# Patient Record
Sex: Male | Born: 1985 | Hispanic: Yes | Marital: Married | Smoking: Never smoker
Health system: Southern US, Community
[De-identification: ages and names within clinical notes are randomized; demographics above are authoritative.]

## PROBLEM LIST (undated history)

## (undated) HISTORY — PX: LEG SURGERY: SHX1003

---

## 2017-03-11 ENCOUNTER — Emergency Department
Admission: EM | Admit: 2017-03-11 | Discharge: 2017-03-11 | Disposition: A | Discharge status: Home / Self Care | Payer: Self-pay | Attending: Emergency Medicine | Admitting: Emergency Medicine

## 2017-03-11 ENCOUNTER — Encounter: Payer: Self-pay | Admitting: Emergency Medicine

## 2017-03-11 ENCOUNTER — Emergency Department: Payer: Self-pay

## 2017-03-11 ENCOUNTER — Other Ambulatory Visit: Payer: Self-pay

## 2017-03-11 DIAGNOSIS — Y9389 Activity, other specified: Secondary | ICD-10-CM | POA: Insufficient documentation

## 2017-03-11 DIAGNOSIS — J029 Acute pharyngitis, unspecified: Secondary | ICD-10-CM

## 2017-03-11 DIAGNOSIS — Y929 Unspecified place or not applicable: Secondary | ICD-10-CM | POA: Insufficient documentation

## 2017-03-11 DIAGNOSIS — X58XXXA Exposure to other specified factors, initial encounter: Secondary | ICD-10-CM | POA: Insufficient documentation

## 2017-03-11 DIAGNOSIS — S27818A Other injury of esophagus (thoracic part), initial encounter: Secondary | ICD-10-CM | POA: Insufficient documentation

## 2017-03-11 DIAGNOSIS — Y999 Unspecified external cause status: Secondary | ICD-10-CM | POA: Insufficient documentation

## 2017-03-11 MED ORDER — MAGIC MOUTHWASH W/LIDOCAINE: 5.0000 mL | Freq: Three times a day (TID) | ORAL | 0 refills | Status: DC

## 2017-03-11 MED ORDER — LIDOCAINE VISCOUS 2 % MT SOLN
15.0000 mL | Freq: Once | OROMUCOSAL | Status: AC
  Administered 2017-03-11: 15 mL via OROMUCOSAL
  Filled 2017-03-11: qty 15

## 2017-03-11 NOTE — ED Notes (Signed)
See triage note  States he was eating chips and watching movies last pm  Noticed pain to left side of throat when he went to bed  Feels like something is stuck  No drooling noted

## 2017-03-11 NOTE — ED Triage Notes (Signed)
C/o pain in back of throat but not like a "sore throat, feels like something is pinching in my throat:". Respirations unlabored. Hoarse voice.

## 2017-03-11 NOTE — ED Provider Notes (Signed)
[facility] Emergency Department Provider Note  ____________________________________________   First MD Initiated Contact with Patient 03/11/17 [phone]     (approximate)  I have reviewed the triage vital signs and the nursing notes.   HISTORY  Chief Complaint Sore Throat    HPI Timothy Roberts is a 32 y.o. male complains of feeling like he has something stuck on the left side of his throat and the top of his mouth, states he was eating chips last night prior to going to bed, he states pain is worse this morning, feels like something is stuck, he denies fever chills, he denies any drooling, he denies any difficulty breathing  History reviewed. No pertinent past medical history.  There are no active problems to display for this patient.   Past Surgical History:  Procedure Laterality Date  . LEG SURGERY      Prior to Admission medications   Medication Sig Start Date End Date Taking? Authorizing Provider  magic mouthwash w/lidocaine SOLN Take 5 mLs by mouth 3 (three) times daily. 03/11/17   Faythe Ghee, PA-C    Allergies Patient has no known allergies.  History reviewed. No pertinent family history.  Social History Social History   Tobacco Use  . Smoking status: Never Smoker  . Smokeless tobacco: Never Used  Substance Use Topics  . Alcohol use: No    Frequency: Never  . Drug use: Yes    Types: Marijuana    Review of Systems  Constitutional: No fever/chills Eyes: No visual changes. ENT: Positive sore throat.  Positive hoarse voice Respiratory: Denies cough Genitourinary: Negative for dysuria. Musculoskeletal: Negative for back pain. Skin: Negative for rash.    ____________________________________________   PHYSICAL EXAM:  VITAL SIGNS: ED Triage Vitals  Enc Vitals Group     BP 03/11/17 0913 124/72     Pulse Rate 03/11/17 0913 (!) 58     Resp 03/11/17 0913 16     Temp 03/11/17 0913 97.7 F (36.5 C)     Temp Source  03/11/17 0913 Oral     SpO2 03/11/17 0913 100 %     Weight 03/11/17 0911 150 lb (68 kg)     Height 03/11/17 0911 6\' 2"  (1.88 m)     Head Circumference --      Peak Flow --      Pain Score 03/11/17 0910 7     Pain Loc --      Pain Edu? --      Excl. in GC? --     Constitutional: Alert and oriented. Well appearing and in no acute distress. Eyes: Conjunctivae are normal.  Head: Atraumatic. Nose: No congestion/rhinnorhea. Mouth/Throat: Mucous membranes are moist.  There are no abrasions or swelling noted in the throat, there is no swelling in the palate of the mouth, there is no foreign body noted with NECK: Supple, no lymphadenopathy was noted, no crepitus Cardiovascular: Normal rate, regular rhythm.  Heart sounds are normal Respiratory: Normal respiratory effort.  No retractions, lungs clear to auscultation GU: deferred Musculoskeletal: FROM all extremities, warm and well perfused Neurologic:  Normal speech and language.  Skin:  Skin is warm, dry and intact. No rash noted. Psychiatric: Mood and affect are normal. Speech and behavior are normal.  ____________________________________________   LABS (all labs ordered are listed, but only abnormal results are displayed)  Labs Reviewed - No data to display ____________________________________________   ____________________________________________  RADIOLOGY  X-ray soft tissue of the neck is negative  ____________________________________________  PROCEDURES  Procedure(s) performed: No  Procedures    ____________________________________________   INITIAL IMPRESSION / ASSESSMENT AND PLAN / ED COURSE  Pertinent labs & imaging results that were available during my care of the patient were reviewed by me and considered in my medical decision making (see chart for details).  Patient is a 32 year old male complaining of pain in his throat, he feels like he has something stuck in his throat, he denies fever chills, he is  able to drink water without difficulty   X-ray of the soft tissues of the neck were ordered    ----------------------------------------- 10:30 AM on 03/11/2017 -----------------------------------------  X-ray of the neck is negative, patient states he still has a lot of pain, prescription for Magic mouthwash with lidocaine was provided, he is to follow-up with ear nose and throat, return to the emergency department if he is worsening, patient states he understands follow-up as needed, he was discharged in stable condition  While the nurse was giving discharge instructions, the patient states he used 1 of the Q-tips from our drawer and stuck it in the back of his throat, but now he has blood on Q-tip, on physical exam the throat is very irritated where he has been scraping it with the Q-tip however there is no active bleeding, and there still is no foreign body noted in the posterior throat explained to the patient not to do this to his throat that he is dry Q-tip will tear of the tissue in the back of the throat, patient just shakes his head says "yeah yeah yeah".  He was given 2% viscous lidocaine and discharged by the nurse with strict instructions to not put anything such as a Q-tip in the back of his throat, he is to follow-up with ear nose and throat  As part of my medical decision making, I reviewed the following data within the electronic MEDICAL RECORD NUMBER Nursing notes reviewed and incorporated, Radiograph reviewed , Notes from prior ED visits and [place] Controlled Substance Database  ____________________________________________   FINAL CLINICAL IMPRESSION(S) / ED DIAGNOSES  Final diagnoses:  Sore throat  Esophageal abrasion, initial encounter      NEW MEDICATIONS STARTED DURING THIS VISIT:  New Prescriptions   MAGIC MOUTHWASH W/LIDOCAINE SOLN    Take 5 mLs by mouth 3 (three) times daily.     Note:  This document was prepared using Dragon voice recognition software and may include  unintentional dictation errors.    Faythe GheeFisher, Susan W, PA-C 03/11/17 1034    Sherrie MustacheFisher, Roselyn BeringSusan W, PA-C 03/11/17 1037    Merrily Brittleifenbark, Neil, MD 03/11/17 1233

## 2017-03-11 NOTE — Discharge Instructions (Signed)
Follow-up with your regular doctor or Dr. Elenore RotaJuengel if not better in 1-2 days, return to the ER if worsening, gargle with warm salt water, use the magic mouthwash as needed for pain,

## 2018-05-06 ENCOUNTER — Emergency Department
Admission: EM | Admit: 2018-05-06 | Discharge: 2018-05-06 | Disposition: A | Discharge status: Home / Self Care | Payer: Self-pay | Attending: Emergency Medicine | Admitting: Emergency Medicine

## 2018-05-06 ENCOUNTER — Encounter: Payer: Self-pay | Admitting: Emergency Medicine

## 2018-05-06 ENCOUNTER — Other Ambulatory Visit: Payer: Self-pay

## 2018-05-06 DIAGNOSIS — J020 Streptococcal pharyngitis: Secondary | ICD-10-CM | POA: Insufficient documentation

## 2018-05-06 LAB — GROUP A STREP BY PCR: GROUP A STREP BY PCR: DETECTED — AB

## 2018-05-06 MED ORDER — AMOXICILLIN 500 MG PO CAPS: 500.0000 mg | ORAL_CAPSULE | Freq: Three times a day (TID) | ORAL | 0 refills | Status: DC

## 2018-05-06 MED ORDER — LIDOCAINE VISCOUS HCL 2 % MT SOLN
5.0000 mL | Freq: Four times a day (QID) | OROMUCOSAL | 0 refills | Status: AC | PRN
Start: 2018-05-06 — End: ?

## 2018-05-06 MED ORDER — LIDOCAINE VISCOUS HCL 2 % MT SOLN
15.0000 mL | Freq: Once | OROMUCOSAL | Status: AC
  Administered 2018-05-06: 15 mL via OROMUCOSAL
  Filled 2018-05-06: qty 15

## 2018-05-06 MED ORDER — DIPHENHYDRAMINE HCL 12.5 MG/5ML PO ELIX
12.5000 mg | ORAL_SOLUTION | Freq: Once | ORAL | Status: AC
  Administered 2018-05-06: 12.5 mg via ORAL
  Filled 2018-05-06: qty 5

## 2018-05-06 MED ORDER — PROMETHAZINE-DM 6.25-15 MG/5ML PO SYRP: 5.0000 mL | ORAL_SOLUTION | Freq: Four times a day (QID) | ORAL | 0 refills | Status: AC | PRN

## 2018-05-06 NOTE — ED Triage Notes (Addendum)
Sore throat for 2 days  Having increased pain with swallowing  Denies any fever or cough

## 2018-05-06 NOTE — ED Provider Notes (Signed)
[facility] Emergency Department Provider Note   ____________________________________________   First MD Initiated Contact with Patient 05/06/18 [phone]     (approximate)  I have reviewed the triage vital signs and the nursing notes.   HISTORY  Chief Complaint Sore Throat  Patient complaint  HPI Timothy Roberts is a 33 y.o. male patient complain of sore throat for 2 days.  Patient state increased dysphagia with food and fluids.  Patient denies URI signs symptoms.  Patient rates pain as 8/10.  Patient described pain as "sore".  No palliative measure for complaint.         History reviewed. No pertinent past medical history.  There are no active problems to display for this patient.   Past Surgical History:  Procedure Laterality Date  . LEG SURGERY      Prior to Admission medications   Medication Sig Start Date End Date Taking? Authorizing Provider  amoxicillin (AMOXIL) 500 MG capsule Take 1 capsule (500 mg total) by mouth 3 (three) times daily. 05/06/18   Joni Reining, PA-C  lidocaine (XYLOCAINE) 2 % solution Use as directed 5 mLs in the mouth or throat every 6 (six) hours as needed for mouth pain. Mix with Phenergan DM for swish and swallow. 05/06/18   Joni Reining, PA-C  promethazine-dextromethorphan (PROMETHAZINE-DM) 6.25-15 MG/5ML syrup Take 5 mLs by mouth 4 (four) times daily as needed for cough. Mix with viscous lidocaine for swish and swallow. 05/06/18   Joni Reining, PA-C    Allergies Patient has no known allergies.  No family history on file.  Social History Social History   Tobacco Use  . Smoking status: Never Smoker  . Smokeless tobacco: Never Used  Substance Use Topics  . Alcohol use: No    Frequency: Never  . Drug use: Yes    Types: Marijuana    Review of Systems Constitutional: No fever/chills Eyes: No visual changes. ENT: Sore throat.   Cardiovascular: Denies chest pain. Respiratory: Denies shortness of  breath. Gastrointestinal: No abdominal pain.  No nausea, no vomiting.  No diarrhea.  No constipation. Genitourinary: Negative for dysuria. Musculoskeletal: Negative for back pain. Skin: Negative for rash. Neurological: Negative for headaches, focal weakness or numbness.   ____________________________________________   PHYSICAL EXAM:  VITAL SIGNS: ED Triage Vitals  Enc Vitals Group     BP      Pulse      Resp      Temp      Temp src      SpO2      Weight      Height      Head Circumference      Peak Flow      Pain Score      Pain Loc      Pain Edu?      Excl. in GC?    Constitutional: Alert and oriented. Well appearing and in no acute distress. Nose: No congestion/rhinnorhea. Mouth/Throat: Mucous membranes are moist.  Oropharynx erythematous. Neck: No stridor. Hematological/Lymphatic/Immunilogical: Bilateral cervical lymphadenopathy. Cardiovascular: Normal rate, regular rhythm. Grossly normal heart sounds.  Good peripheral circulation. Respiratory: Normal respiratory effort.  No retractions. Lungs CTAB. Skin:  Skin is warm, dry and intact. No rash noted. Psychiatric: Mood and affect are normal. Speech and behavior are normal.  ____________________________________________   LABS (all labs ordered are listed, but only abnormal results are displayed)  Labs Reviewed  GROUP A STREP BY PCR - Abnormal; Notable for the following components:  Result Value   Group A Strep by PCR DETECTED (*)    All other components within normal limits   ____________________________________________  EKG   ____________________________________________  RADIOLOGY  ED MD interpretation:    Official radiology report(s): No results found.  ____________________________________________   PROCEDURES  Procedure(s) performed (including Critical Care):  Procedures   ____________________________________________   INITIAL IMPRESSION / ASSESSMENT AND PLAN / ED COURSE  As part  of my medical decision making, I reviewed the following data within the electronic MEDICAL RECORD NUMBER         Patient presents with sore throat for 2 days.  Patient is positive for strep pharyngitis.  Patient given discharge care instruction advised take medication as directed.  Patient advised follow-up [facility] as needed.      ____________________________________________   FINAL CLINICAL IMPRESSION(S) / ED DIAGNOSES  Final diagnoses:  Strep throat     ED Discharge Orders         Ordered    amoxicillin (AMOXIL) 500 MG capsule  3 times daily     05/06/18 0759    lidocaine (XYLOCAINE) 2 % solution  Every 6 hours PRN     05/06/18 0759    promethazine-dextromethorphan (PROMETHAZINE-DM) 6.25-15 MG/5ML syrup  4 times daily PRN     05/06/18 0759           Note:  This document was prepared using Dragon voice recognition software and may include unintentional dictation errors.    Joni Reining, PA-C 05/06/18 0802    Nita Sickle, MD 05/06/18 [phone]

## 2018-05-07 ENCOUNTER — Encounter: Payer: Self-pay | Admitting: Emergency Medicine

## 2018-05-07 ENCOUNTER — Emergency Department: Payer: Self-pay | Admitting: Anesthesiology

## 2018-05-07 ENCOUNTER — Emergency Department
Admission: EM | Admit: 2018-05-07 | Discharge: 2018-05-07 | Disposition: A | Discharge status: Home / Self Care | Payer: Self-pay | Attending: Emergency Medicine | Admitting: Emergency Medicine

## 2018-05-07 ENCOUNTER — Encounter
Admission: EM | Disposition: A | Discharge status: Home / Self Care | Payer: Self-pay | Source: Home / Self Care | Attending: Emergency Medicine

## 2018-05-07 ENCOUNTER — Emergency Department: Payer: Self-pay

## 2018-05-07 ENCOUNTER — Other Ambulatory Visit: Payer: Self-pay

## 2018-05-07 DIAGNOSIS — J36 Peritonsillar abscess: Secondary | ICD-10-CM | POA: Insufficient documentation

## 2018-05-07 DIAGNOSIS — F17200 Nicotine dependence, unspecified, uncomplicated: Secondary | ICD-10-CM | POA: Insufficient documentation

## 2018-05-07 HISTORY — PX: INCISION AND DRAINAGE ABSCESS: SHX5864

## 2018-05-07 LAB — CBC WITH DIFFERENTIAL/PLATELET
Abs Immature Granulocytes: 0.08 10*3/uL — ABNORMAL HIGH (ref 0.00–0.07)
BASOS PCT: 0 %
Basophils Absolute: 0.1 10*3/uL (ref 0.0–0.1)
EOS ABS: 0 10*3/uL (ref 0.0–0.5)
Eosinophils Relative: 0 %
HEMATOCRIT: 45.6 % (ref 39.0–52.0)
Hemoglobin: 15.4 g/dL (ref 13.0–17.0)
Immature Granulocytes: 1 %
LYMPHS ABS: 2.2 10*3/uL (ref 0.7–4.0)
Lymphocytes Relative: 13 %
MCH: 30.1 pg (ref 26.0–34.0)
MCHC: 33.8 g/dL (ref 30.0–36.0)
MCV: 89.2 fL (ref 80.0–100.0)
MONO ABS: 1.9 10*3/uL — AB (ref 0.1–1.0)
MONOS PCT: 11 %
Neutro Abs: 13 10*3/uL — ABNORMAL HIGH (ref 1.7–7.7)
Neutrophils Relative %: 75 %
PLATELETS: 232 10*3/uL (ref 150–400)
RBC: 5.11 MIL/uL (ref 4.22–5.81)
RDW: 12.8 % (ref 11.5–15.5)
WBC: 17.2 10*3/uL — ABNORMAL HIGH (ref 4.0–10.5)
nRBC: 0 % (ref 0.0–0.2)

## 2018-05-07 LAB — BASIC METABOLIC PANEL
Anion gap: 13 (ref 5–15)
BUN: 15 mg/dL (ref 6–20)
CO2: 25 mmol/L (ref 22–32)
Calcium: 9.7 mg/dL (ref 8.9–10.3)
Chloride: 100 mmol/L (ref 98–111)
Creatinine, Ser: 0.95 mg/dL (ref 0.61–1.24)
GFR calc Af Amer: >60 mL/min (ref >60)
GLUCOSE: 135 mg/dL — AB (ref 70–99)
POTASSIUM: 3.6 mmol/L (ref 3.5–5.1)
Sodium: 138 mmol/L (ref 135–145)

## 2018-05-07 SURGERY — INCISION AND DRAINAGE, ABSCESS
Anesthesia: General

## 2018-05-07 MED ORDER — DEXAMETHASONE SODIUM PHOSPHATE 10 MG/ML IJ SOLN
INTRAMUSCULAR | Status: AC
  Filled 2018-05-07: qty 1

## 2018-05-07 MED ORDER — OXYCODONE HCL 5 MG/5ML PO SOLN: 5.0000 mg | Freq: Once | ORAL | Status: DC | PRN

## 2018-05-07 MED ORDER — FENTANYL CITRATE (PF) 100 MCG/2ML IJ SOLN
INTRAMUSCULAR | Status: AC
  Filled 2018-05-07: qty 2

## 2018-05-07 MED ORDER — SODIUM CHLORIDE 0.9 % IV BOLUS
1000.0000 mL | Freq: Once | INTRAVENOUS | Status: AC
  Administered 2018-05-07: 1000 mL via INTRAVENOUS

## 2018-05-07 MED ORDER — DEXAMETHASONE SODIUM PHOSPHATE 10 MG/ML IJ SOLN
INTRAMUSCULAR | Status: DC | PRN
  Administered 2018-05-07: 10 mg via INTRAVENOUS

## 2018-05-07 MED ORDER — PROMETHAZINE HCL 25 MG/ML IJ SOLN: 6.2500 mg | INTRAMUSCULAR | Status: DC | PRN

## 2018-05-07 MED ORDER — SODIUM CHLORIDE 0.9 % IV SOLN
3.0000 g | Freq: Four times a day (QID) | INTRAVENOUS | Status: DC
  Administered 2018-05-07: 3 g via INTRAVENOUS
  Filled 2018-05-07 (×3): qty 3

## 2018-05-07 MED ORDER — METHYLPREDNISOLONE SODIUM SUCC 125 MG IJ SOLR: 125.0000 mg | Freq: Once | INTRAMUSCULAR | Status: DC

## 2018-05-07 MED ORDER — FENTANYL CITRATE (PF) 100 MCG/2ML IJ SOLN
25.0000 ug | INTRAMUSCULAR | Status: DC | PRN
  Administered 2018-05-07 (×2): 50 ug via INTRAVENOUS

## 2018-05-07 MED ORDER — EPINEPHRINE 0.3 MG/0.3ML IJ SOAJ: 0.3000 mg | Freq: Once | INTRAMUSCULAR | Status: DC

## 2018-05-07 MED ORDER — ROCURONIUM BROMIDE 50 MG/5ML IV SOLN
INTRAVENOUS | Status: AC
  Filled 2018-05-07: qty 1

## 2018-05-07 MED ORDER — MIDAZOLAM HCL 2 MG/2ML IJ SOLN
INTRAMUSCULAR | Status: AC
  Filled 2018-05-07: qty 2

## 2018-05-07 MED ORDER — FAMOTIDINE IN NACL 20-0.9 MG/50ML-% IV SOLN: 20.0000 mg | Freq: Once | INTRAVENOUS | Status: DC

## 2018-05-07 MED ORDER — OXYCODONE HCL 5 MG PO TABS: 5.0000 mg | ORAL_TABLET | Freq: Once | ORAL | Status: DC | PRN

## 2018-05-07 MED ORDER — ONDANSETRON HCL 4 MG/2ML IJ SOLN
INTRAMUSCULAR | Status: AC
  Filled 2018-05-07: qty 2

## 2018-05-07 MED ORDER — BUPIVACAINE HCL 0.5 % IJ SOLN
INTRAMUSCULAR | Status: DC | PRN
  Administered 2018-05-07: 3 mL

## 2018-05-07 MED ORDER — ONDANSETRON HCL 4 MG/2ML IJ SOLN
INTRAMUSCULAR | Status: DC | PRN
  Administered 2018-05-07: 4 mg via INTRAVENOUS

## 2018-05-07 MED ORDER — ROCURONIUM BROMIDE 100 MG/10ML IV SOLN
INTRAVENOUS | Status: DC | PRN
  Administered 2018-05-07: 20 mg via INTRAVENOUS

## 2018-05-07 MED ORDER — PROPOFOL 10 MG/ML IV BOLUS
INTRAVENOUS | Status: AC
  Filled 2018-05-07: qty 20

## 2018-05-07 MED ORDER — PROPOFOL 10 MG/ML IV BOLUS
INTRAVENOUS | Status: DC | PRN
  Administered 2018-05-07: 130 mg via INTRAVENOUS

## 2018-05-07 MED ORDER — LIDOCAINE HCL (PF) 2 % IJ SOLN
INTRAMUSCULAR | Status: AC
  Filled 2018-05-07: qty 10

## 2018-05-07 MED ORDER — SUGAMMADEX SODIUM 200 MG/2ML IV SOLN
INTRAVENOUS | Status: AC
  Filled 2018-05-07: qty 2

## 2018-05-07 MED ORDER — LIDOCAINE HCL (CARDIAC) PF 100 MG/5ML IV SOSY
PREFILLED_SYRINGE | INTRAVENOUS | Status: DC | PRN
  Administered 2018-05-07: 40 mg via INTRAVENOUS

## 2018-05-07 MED ORDER — MIDAZOLAM HCL 2 MG/2ML IJ SOLN
INTRAMUSCULAR | Status: DC | PRN
  Administered 2018-05-07: 2 mg via INTRAVENOUS

## 2018-05-07 MED ORDER — AMOXICILLIN-POT CLAVULANATE 875-125 MG PO TABS: 1.0000 | ORAL_TABLET | Freq: Two times a day (BID) | ORAL | 0 refills | Status: AC

## 2018-05-07 MED ORDER — DIPHENHYDRAMINE HCL 50 MG/ML IJ SOLN: 25.0000 mg | Freq: Once | INTRAMUSCULAR | Status: DC

## 2018-05-07 MED ORDER — LACTATED RINGERS IV SOLN
INTRAVENOUS | Status: DC | PRN
  Administered 2018-05-07: 08:00:00 via INTRAVENOUS

## 2018-05-07 MED ORDER — FENTANYL CITRATE (PF) 100 MCG/2ML IJ SOLN
INTRAMUSCULAR | Status: DC | PRN
  Administered 2018-05-07: 100 ug via INTRAVENOUS

## 2018-05-07 MED ORDER — CLINDAMYCIN PHOSPHATE 600 MG/50ML IV SOLN
INTRAVENOUS | Status: AC
  Filled 2018-05-07: qty 50

## 2018-05-07 MED ORDER — SODIUM CHLORIDE 0.9 % IV SOLN
3.0000 g | Freq: Once | INTRAVENOUS | Status: AC
  Administered 2018-05-07: 3 g via INTRAVENOUS
  Filled 2018-05-07: qty 3

## 2018-05-07 MED ORDER — MEPERIDINE HCL 50 MG/ML IJ SOLN: 6.2500 mg | INTRAMUSCULAR | Status: DC | PRN

## 2018-05-07 MED ORDER — CLINDAMYCIN PHOSPHATE 300 MG/2ML IJ SOLN
INTRAMUSCULAR | Status: DC | PRN
  Administered 2018-05-07: 300 mg

## 2018-05-07 MED ORDER — SUGAMMADEX SODIUM 200 MG/2ML IV SOLN
INTRAVENOUS | Status: DC | PRN
  Administered 2018-05-07: 136 mg via INTRAVENOUS

## 2018-05-07 MED ORDER — SUCCINYLCHOLINE CHLORIDE 20 MG/ML IJ SOLN
INTRAMUSCULAR | Status: DC | PRN
  Administered 2018-05-07: 100 mg via INTRAVENOUS

## 2018-05-07 MED ORDER — HYDROCODONE-ACETAMINOPHEN 7.5-325 MG/15ML PO SOLN: 15.0000 mL | Freq: Four times a day (QID) | ORAL | 0 refills | Status: AC | PRN

## 2018-05-07 MED ORDER — DEXAMETHASONE SODIUM PHOSPHATE 10 MG/ML IJ SOLN
10.0000 mg | Freq: Once | INTRAMUSCULAR | Status: AC
  Administered 2018-05-07: 10 mg via INTRAVENOUS

## 2018-05-07 MED ORDER — SUCCINYLCHOLINE CHLORIDE 20 MG/ML IJ SOLN
INTRAMUSCULAR | Status: AC
  Filled 2018-05-07: qty 1

## 2018-05-07 MED ORDER — IOHEXOL 300 MG/ML  SOLN
75.0000 mL | Freq: Once | INTRAMUSCULAR | Status: AC | PRN
  Administered 2018-05-07: 75 mL via INTRAVENOUS

## 2018-05-07 SURGICAL SUPPLY — 17 items
BAG DECANTER FOR FLEXI CONT (MISCELLANEOUS) ×3 IMPLANT
BLADE SURG 15 STRL LF DISP TIS (BLADE) ×1 IMPLANT
BLADE SURG 15 STRL SS (BLADE) ×2
CANISTER SUCT 1200ML W/VALVE (MISCELLANEOUS) ×3 IMPLANT
COVER WAND RF STERILE (DRAPES) ×3 IMPLANT
ELECT CAUTERY BLADE TIP 2.5 (TIP) ×3
ELECT REM PT RETURN 9FT ADLT (ELECTROSURGICAL) ×3
ELECTRODE CAUTERY BLDE TIP 2.5 (TIP) ×1 IMPLANT
ELECTRODE REM PT RTRN 9FT ADLT (ELECTROSURGICAL) ×1 IMPLANT
GLOVE BIO SURGEON STRL SZ7.5 (GLOVE) ×3 IMPLANT
HANDLE SUCTION POOLE (INSTRUMENTS) ×1 IMPLANT
NDL SAFETY ECLIPSE 18X1.5 (NEEDLE) ×1 IMPLANT
NEEDLE HYPO 18GX1.5 SHARP (NEEDLE) ×2
NS IRRIG 500ML POUR BTL (IV SOLUTION) ×3 IMPLANT
PACK HEAD/NECK (MISCELLANEOUS) ×3 IMPLANT
SUCTION POOLE HANDLE (INSTRUMENTS) ×3
SWAB CULTURE AMIES ANAERIB BLU (MISCELLANEOUS) ×3 IMPLANT

## 2018-05-07 NOTE — Op Note (Signed)
05/07/2018 8:02 AM    Adele Schilder  [id number]   Pre-Op Dx: Left peritonsillar Abscess  Post-op Dx: same  Proc:  I&D left PTA  Surg:  Davina Poke  Anes:  GOT  EBL: Less than 20 cc  Comp: None  Findings: Approximately 8 to 10 cc of pus left peritonsillar region  Procedure: With the patient in a comfortable supine position, GOT was administered in standard fashion.    At an appropriate level,  the table was turned 90 degrees away from anesthesia  and placed in Trendelenberg position. Routine clean preparation and draping was performed. Taking care to protect lips, teeth and endotracheal tube, the Crowe-Davis mouth gag was introduced, expanded for visualization, and suspended from the Mayo stand in the standard fashion.  The findings were as described as above.  A 22-gauge needle on a seeker syringe was used to probe the peritonsillar region.  Pus was aspirated and the tract was marked.  A 15 blade was then used to incise along the tonsillar mucosal region.  Was used to perform a crescent incision above the tonsil.  This was carried down on the capsule of the tonsil.  A frank abscess cavity was encountered, and widely opened.  A curved hemostat was then placed into the abscess cavity and opened widely to open any loculations.  No further pus was identified.  Clindamycin 300 mg was placed in 500 cc of saline this was used to irrigate the peritonsillar cavity completely.  Hemostasis was spontaneous.  The mouth gag was relaxed for several minutes.  Upon re-expansion, hemostasis was observed.  At this point the procedure was completed.  The mouth gag was relaxed and removed.  The dental status was  Intact.  The patient was returned to Anesthesia, awakened, extubated, and transferred to PACU in stable condition.    Dispo:   PACU to Home  Plan:  Hydration, antibiosis, analgesia.   Given low anticipated risk of post-anesthetic or post-surgical complications, feel an outpatient venue  is appropriate.  Davina Poke 05/07/2018 8:02 AM

## 2018-05-07 NOTE — Anesthesia Preprocedure Evaluation (Signed)
Anesthesia Evaluation  Patient identified by MRN, date of birth, ID band Patient awake    Reviewed: Allergy & Precautions, NPO status , Patient's Chart, lab work & pertinent test results  History of Anesthesia Complications Negative for: history of anesthetic complications  Airway Mallampati: II  TM Distance: >3 FB Neck ROM: Full    Dental no notable dental hx.    Pulmonary neg sleep apnea, neg COPD, Current Smoker,    breath sounds clear to auscultation- rhonchi (-) wheezing      Cardiovascular Exercise Tolerance: Good (-) hypertension(-) CAD and (-) Past MI  Rhythm:Regular Rate:Normal - Systolic murmurs and - Diastolic murmurs    Neuro/Psych neg Seizures negative neurological ROS  negative psych ROS   GI/Hepatic negative GI ROS, Neg liver ROS,   Endo/Other  negative endocrine ROSneg diabetes  Renal/GU negative Renal ROS     Musculoskeletal negative musculoskeletal ROS (+)   Abdominal (+) - obese,   Peds  Hematology negative hematology ROS (+)   Anesthesia Other Findings   Reproductive/Obstetrics                             Anesthesia Physical Anesthesia Plan  ASA: II  Anesthesia Plan: General   Post-op Pain Management:    Induction: Intravenous  PONV Risk Score and Plan: 0 and Ondansetron, Dexamethasone and Midazolam  Airway Management Planned: Oral ETT  Additional Equipment:   Intra-op Plan:   Post-operative Plan: Extubation in OR  Informed Consent: I have reviewed the patients History and Physical, chart, labs and discussed the procedure including the risks, benefits and alternatives for the proposed anesthesia with the patient or authorized representative who has indicated his/her understanding and acceptance.     Dental advisory given  Plan Discussed with: CRNA and Anesthesiologist  Anesthesia Plan Comments:         Anesthesia Quick Evaluation

## 2018-05-07 NOTE — Transfer of Care (Signed)
Immediate Anesthesia Transfer of Care Note  Patient: Timothy Roberts  Procedure(s) Performed: INCISION AND DRAINAGE ABSCESS Peritonsillar (N/A )  Patient Location: PACU  Anesthesia Type:General  Level of Consciousness: awake, alert  and oriented  Airway & Oxygen Therapy: Patient Spontanous Breathing and Patient connected to face mask oxygen  Post-op Assessment: Report given to RN and Post -op Vital signs reviewed and stable  Post vital signs: Reviewed and stable  Last Vitals:  Vitals Value Taken Time  BP 118/79 05/07/2018  8:15 AM  Temp 36.4 C 05/07/2018  8:15 AM  Pulse 77 05/07/2018  8:20 AM  Resp 19 05/07/2018  8:20 AM  SpO2 100 % 05/07/2018  8:20 AM  Vitals shown include unvalidated device data.  Last Pain:  Vitals:   05/07/18 0815  PainSc: 0-No pain         Complications: No apparent anesthesia complications

## 2018-05-07 NOTE — ED Notes (Signed)
ED Provider at bedside. 

## 2018-05-07 NOTE — Discharge Instructions (Signed)

## 2018-05-07 NOTE — Consult Note (Signed)
Timothy Roberts, Timothy Roberts [id number] [birth year] Irean Hong, MD  Reason for Consult: Peritonsiller abscess  HPI: Approximately 4-day history of severe sore throat.  Was seen in the emergency room yesterday was tested positive for strep.  Progressively got worse return to the emergency room where a CT scan done showed a approximately 2 and half centimeter peritonsillar abscess.  Allergies: No Known Allergies  ROS: Review of systems normal other than 12 systems except per HPI.  PMH: History reviewed. No pertinent past medical history.  FH: No family history on file.  SH:  Social History   Socioeconomic History  . Marital status: Married    Spouse name: Not on file  . Number of children: Not on file  . Years of education: Not on file  . Highest education level: Not on file  Occupational History  . Not on file  Social Needs  . Financial resource strain: Not on file  . Food insecurity:    Worry: Not on file    Inability: Not on file  . Transportation needs:    Medical: Not on file    Non-medical: Not on file  Tobacco Use  . Smoking status: Never Smoker  . Smokeless tobacco: Never Used  Substance and Sexual Activity  . Alcohol use: No    Frequency: Never  . Drug use: Yes    Types: Marijuana  . Sexual activity: Not on file  Lifestyle  . Physical activity:    Days per week: Not on file    Minutes per session: Not on file  . Stress: Not on file  Relationships  . Social connections:    Talks on phone: Not on file    Gets together: Not on file    Attends religious service: Not on file    Active member of club or organization: Not on file    Attends meetings of clubs or organizations: Not on file    Relationship status: Not on file  . Intimate partner violence:    Fear of current or ex partner: Not on file    Emotionally abused: Not on file    Physically abused: Not on file    Forced sexual activity: Not on file  Other Topics Concern  . Not on file  Social History  Narrative  . Not on file    PSH:  Past Surgical History:  Procedure Laterality Date  . LEG SURGERY      Physical  Exam: CN 2-12 grossly intact and symmetric. EAC/TMs normal BL.  Oral cavity oropharynx shows mild trismus with obvious tonsillitis and left-sided parapharyngeal swelling consistent with peritonsillar abscess.  Nasal cavity without polyps or purulence. External nose and ears without masses or lesions. EOMI, PERRLA. Neck supple with no masses or lesions. . Thyroid normal with no masses.  Heart had regular rate and rhythm lungs clear to auscultation   A/P: CT scan reviewed there is an obvious left peritonsillar abscess is consistent with physical findings.  We will take him to the operating room for incision and drainage of peritonsillar abscess he understands risk and benefits of pain bleeding infection and is eager to proceed.   Davina Poke 05/07/2018 7:00 AM

## 2018-05-07 NOTE — Anesthesia Postprocedure Evaluation (Signed)
Anesthesia Post Note  Patient: Timothy Roberts  Procedure(s) Performed: INCISION AND DRAINAGE ABSCESS Peritonsillar (N/A )  Patient location during evaluation: PACU Anesthesia Type: General Level of consciousness: awake and alert and oriented Pain management: pain level controlled Vital Signs Assessment: post-procedure vital signs reviewed and stable Respiratory status: spontaneous breathing, nonlabored ventilation and respiratory function stable Cardiovascular status: blood pressure returned to baseline and stable Postop Assessment: no signs of nausea or vomiting Anesthetic complications: no     Last Vitals:  Vitals:   05/07/18 0908 05/07/18 1014  BP: 135/84 126/73  Pulse: 79 64  Resp: 16 16  Temp: 37.2 C   SpO2: 100% 100%    Last Pain:  Vitals:   05/07/18 0908  TempSrc: Temporal  PainSc: 5                  Crystel Demarco

## 2018-05-07 NOTE — ED Notes (Signed)
Patient transported to CT 

## 2018-05-07 NOTE — ED Triage Notes (Addendum)
Patient ambulatory to triage with steady gait, without difficulty, pt anxious, voice muffled; pt reports throat swelling, difficulty swallowing after taking amoxicillin and phenergan DM approx 7pm

## 2018-05-07 NOTE — ED Provider Notes (Signed)
[facility] Emergency Department Provider Note   ____________________________________________   First MD Initiated Contact with Patient 05/07/18 [phone]     (approximate)  I have reviewed the triage vital signs and the nursing notes.   HISTORY  Chief Complaint Allergic Reaction    HPI Timothy Roberts is a 33 y.o. male who presents to the ED from home with a chief complaint of sore throat and difficulty swallowing.  Patient was seen in the ED on 3/17 for sore throat, found to be strep positive and discharged home on Amoxicillin and Phenergan DM.  Patient had never taken Amoxicillin before but took 3 doses.  Since approximately 7 PM he has been having more difficulty swallowing and feels like his throat is swelling.  Denies rash, chest pain, shortness of breath, facial swelling, abdominal pain, nausea, vomiting or diarrhea.       Past medical history None  There are no active problems to display for this patient.   Past Surgical History:  Procedure Laterality Date   LEG SURGERY      Prior to Admission medications   Medication Sig Start Date End Date Taking? Authorizing Provider  amoxicillin (AMOXIL) 500 MG capsule Take 1 capsule (500 mg total) by mouth 3 (three) times daily. 05/06/18   Joni Reining, PA-C  lidocaine (XYLOCAINE) 2 % solution Use as directed 5 mLs in the mouth or throat every 6 (six) hours as needed for mouth pain. Mix with Phenergan DM for swish and swallow. 05/06/18   Joni Reining, PA-C  promethazine-dextromethorphan (PROMETHAZINE-DM) 6.25-15 MG/5ML syrup Take 5 mLs by mouth 4 (four) times daily as needed for cough. Mix with viscous lidocaine for swish and swallow. 05/06/18   Joni Reining, PA-C    Allergies Patient has no known allergies.  No family history on file.  Social History Social History   Tobacco Use   Smoking status: Never Smoker   Smokeless tobacco: Never Used  Substance Use Topics   Alcohol use: No   Frequency: Never   Drug use: Yes    Types: Marijuana    Review of Systems  Constitutional: No fever/chills Eyes: No visual changes. ENT: Positive for sore throat and difficulty swallowing. Cardiovascular: Denies chest pain. Respiratory: Denies shortness of breath. Gastrointestinal: No abdominal pain.  No nausea, no vomiting.  No diarrhea.  No constipation. Genitourinary: Negative for dysuria. Musculoskeletal: Negative for back pain. Skin: Negative for rash. Neurological: Negative for headaches, focal weakness or numbness.   ____________________________________________   PHYSICAL EXAM:  VITAL SIGNS: ED Triage Vitals  Enc Vitals Group     BP      Pulse      Resp      Temp      Temp src      SpO2      Weight      Height      Head Circumference      Peak Flow      Pain Score      Pain Loc      Pain Edu?      Excl. in GC?     Constitutional: Alert and oriented. Well appearing and in mild acute distress. Eyes: Conjunctivae are normal. PERRL. EOMI. Head: Atraumatic. Nose: No congestion/rhinnorhea. Mouth/Throat: There is no tongue or face or lip angioedema.  Mucous membranes are moist.  Oropharynx erythematous erythematous with left tonsillar swelling consistent with peritonsillar abscess.  Slightly muffled voice.  There is no drooling. Neck: No stridor.  Supple  neck without meningismus. Hematological/Lymphatic/Immunilogical: Positive for bilateral anterior shotty cervical lymphadenopathy. Cardiovascular: Normal rate, regular rhythm. Grossly normal heart sounds.  Good peripheral circulation. Respiratory: Normal respiratory effort.  No retractions. Lungs CTAB. Gastrointestinal: Soft and nontender. No distention. No abdominal bruits. No CVA tenderness. Musculoskeletal: No lower extremity tenderness nor edema.  No joint effusions. Neurologic:  Normal speech and language. No gross focal neurologic deficits are appreciated. No gait instability. Skin:  Skin is warm, dry and  intact. No rash noted.  No urticaria. Psychiatric: Mood and affect are normal. Speech and behavior are normal.  ____________________________________________   LABS (all labs ordered are listed, but only abnormal results are displayed)  Labs Reviewed  CBC WITH DIFFERENTIAL/PLATELET - Abnormal; Notable for the following components:      Result Value   WBC 17.2 (*)    Neutro Abs 13.0 (*)    Monocytes Absolute 1.9 (*)    Abs Immature Granulocytes 0.08 (*)    All other components within normal limits  BASIC METABOLIC PANEL - Abnormal; Notable for the following components:   Glucose, Bld 135 (*)    All other components within normal limits   ____________________________________________  EKG  None ____________________________________________  RADIOLOGY  ED MD interpretation: 2.4 cm left PTA with associated edema and retropharyngeal edema  Official radiology report(s): Ct Soft Tissue Neck W Contrast  Result Date: 05/07/2018 CLINICAL DATA:  Evaluate left peritonsillar abscess EXAM: CT NECK WITH CONTRAST TECHNIQUE: Multidetector CT imaging of the neck was performed using the standard protocol following the bolus administration of intravenous contrast. CONTRAST:  65mL OMNIPAQUE IOHEXOL 300 MG/ML  SOLN COMPARISON:  None. FINDINGS: Pharynx and larynx: Rim enhancing fluid collection in the left tonsillar fossa measuring 2.4 cm. Submucosal edema extends in the left more than right oropharynx and supraglottic larynx. There is retropharyngeal edema without retropharyngeal collection. Salivary glands: Negative Thyroid: Negative Lymph nodes: Enlarged lymph nodes on the left more than right consistent with adenitis. No nodal suppuration. Vascular: Negative.  Major venous structures are patent Limited intracranial: Negative Visualized orbits: Minimal coverage is negative Mastoids and visualized paranasal sinuses: Essentially clear Skeleton: Negative Upper chest: Clear apical lungs. IMPRESSION: 1. 2.4 cm  left peritonsillar abscess. Associated submucosal edema tracks in the left more than right oropharynx and supraglottic larynx. Mild retropharyngeal edema. 2. Lymphadenitis without cavitation. Electronically Signed   By: Marnee Spring M.D.   On: 05/07/2018 05:27    ____________________________________________   PROCEDURES  Procedure(s) performed (including Critical Care):  Procedures  CRITICAL CARE Performed by: Irean Hong   Total critical care time: 30 minutes  Critical care time was exclusive of separately billable procedures and treating other patients.  Critical care was necessary to treat or prevent imminent or life-threatening deterioration.  Critical care was time spent personally by me on the following activities: development of treatment plan with patient and/or surrogate as well as nursing, discussions with consultants, evaluation of patient's response to treatment, examination of patient, obtaining history from patient or surrogate, ordering and performing treatments and interventions, ordering and review of laboratory studies, ordering and review of radiographic studies, pulse oximetry and re-evaluation of patient's condition. ____________________________________________   INITIAL IMPRESSION / ASSESSMENT AND PLAN / ED COURSE  As part of my medical decision making, I reviewed the following data within the electronic MEDICAL RECORD NUMBER Nursing notes reviewed and incorporated, Labs reviewed, Old chart reviewed and Notes from prior ED visits        33 year old otherwise healthy male who returns to the ED with  difficulty swallowing and muffled voice.  Initially thought to be acute allergic reaction.  However, in the setting of no urticaria or angioedema and most probable left PTA clinical exam, I have canceled allergic reaction cocktail.  Instead will administer 10 mg IV Decadron and 3 g IV Unasyn.  Will obtain basic lab work and CT neck for further characterization of  peritonsillar abscess.   Clinical Course as of May 07 614  Wed May 07, 2018  0552 Feeling somewhat better.  Left-sided tonsil remains full.  Updated patient on CT results.  Have paged ENT on call to discuss.   [JS]  [id number] Spoke with Dr. Jenne Campus who viewed patient's CT scan; will evaluate patient in the ED and plans to take him to the OR.  Updated patient and clarified with him that he has not eaten in 3 days.   [JS]    Clinical Course User Index [JS] Irean Hong, MD     ____________________________________________   FINAL CLINICAL IMPRESSION(S) / ED DIAGNOSES  Final diagnoses:  Peritonsillar abscess     ED Discharge Orders    None       Note:  This document was prepared using Dragon voice recognition software and may include unintentional dictation errors.   Irean Hong, MD 05/07/18 [phone]

## 2018-05-07 NOTE — Anesthesia Procedure Notes (Signed)
Procedure Name: Intubation Date/Time: 05/07/2018 7:45 AM Performed by: Allean Found, CRNA Pre-anesthesia Checklist: Patient identified, Patient being monitored, Timeout performed, Emergency Drugs available and Suction available Patient Re-evaluated:Patient Re-evaluated prior to induction Oxygen Delivery Method: Circle system utilized Preoxygenation: Pre-oxygenation with 100% oxygen Induction Type: IV induction Ventilation: Mask ventilation without difficulty Laryngoscope Size: Mac and 3 Grade View: Grade I Tube type: Oral Tube size: 7.0 mm Number of attempts: 1 Airway Equipment and Method: Stylet Placement Confirmation: ETT inserted through vocal cords under direct vision,  positive ETCO2 and breath sounds checked- equal and bilateral Secured at: 21 cm Tube secured with: Tape Dental Injury: Teeth and Oropharynx as per pre-operative assessment

## 2018-05-07 NOTE — Anesthesia Post-op Follow-up Note (Signed)
Anesthesia QCDR form completed.        

## 2020-02-17 IMAGING — CT CT NECK WITH CONTRAST
3 of 5 series · 11 of 33 positions shown, 13 images · IV contrast (omnipaque)
Comparison: None.

CLINICAL DATA: Evaluate left peritonsillar abscess

EXAM:
CT NECK WITH CONTRAST
TECHNIQUE: Multidetector CT imaging of the neck was performed using the
standard protocol following the bolus administration of intravenous
contrast.
CONTRAST:  75mL OMNIPAQUE IOHEXOL 300 MG/ML  SOLN

[Series 2: axial neck · axial · 0.52mm/px · z∈[-193,-57]mm · 3 of 137 slices shown, 4 images]
[im 35/137  soft-tissue]
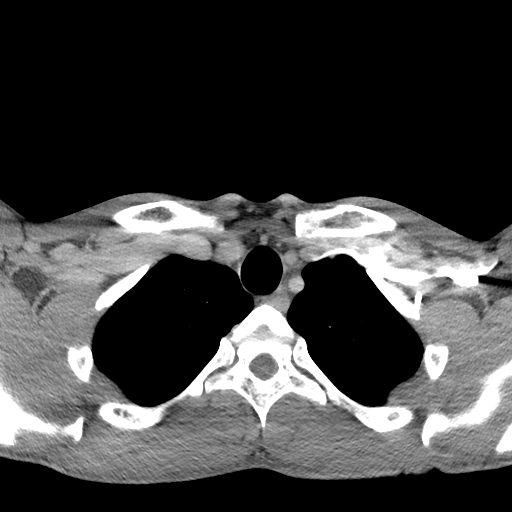
[im 35/137  bone]
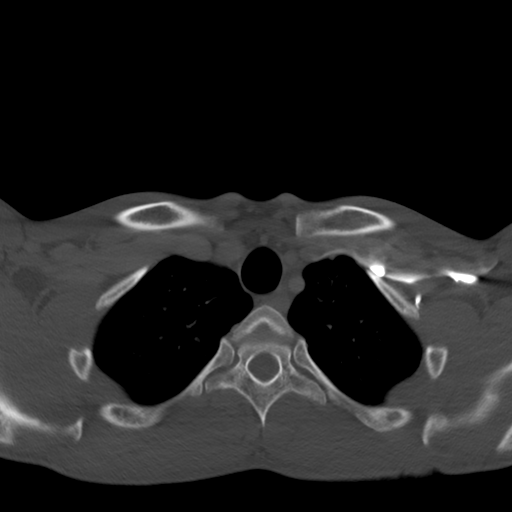
[im 69/137  bone]
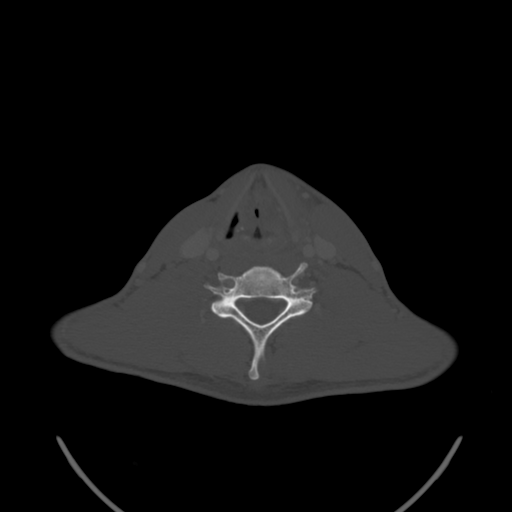
[im 103/137  bone]
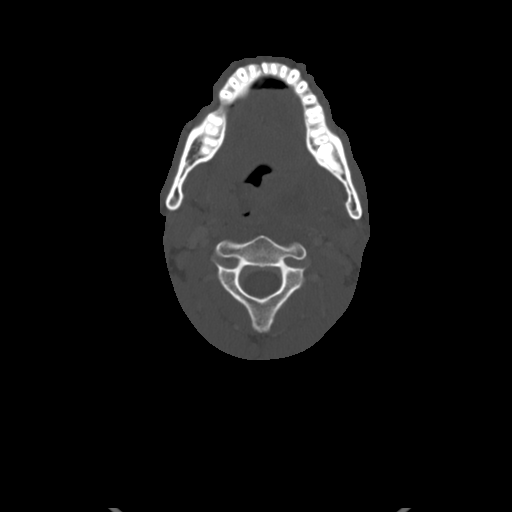

[Series 6: sag neck · sagittal · 0.44mm/px · 5 of 67 slices shown, 6 images]
[im 23/67  bone]
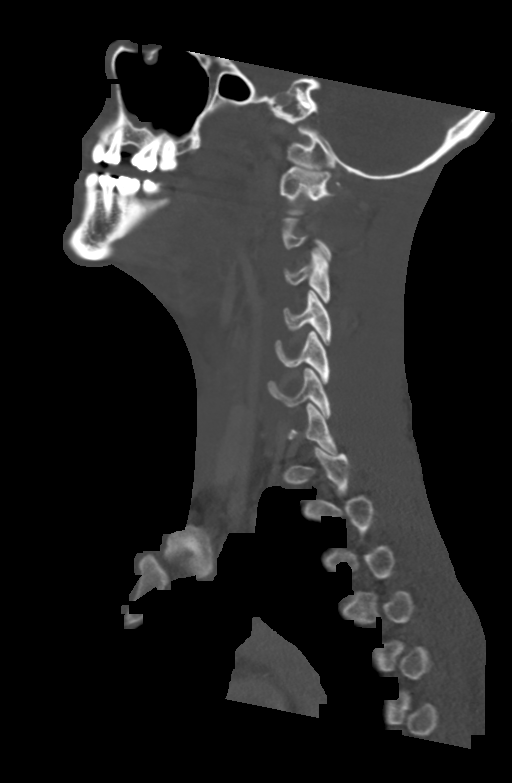
[im 28/67  bone]
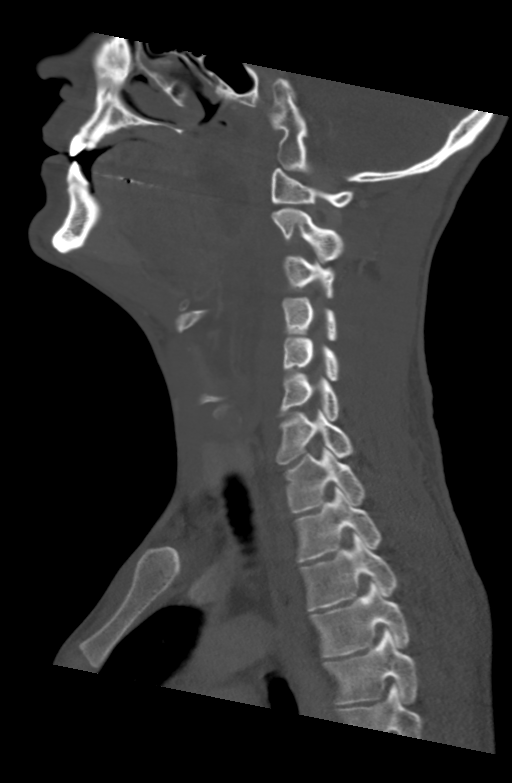
[im 34/67  soft-tissue]
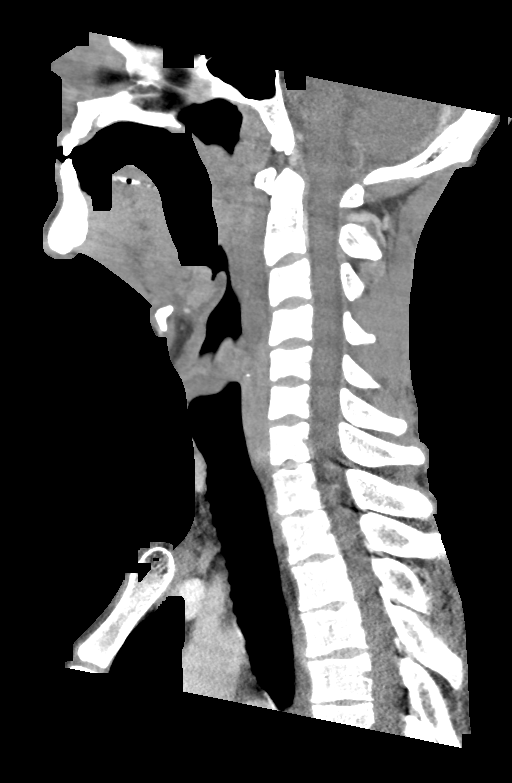
[im 34/67  bone]
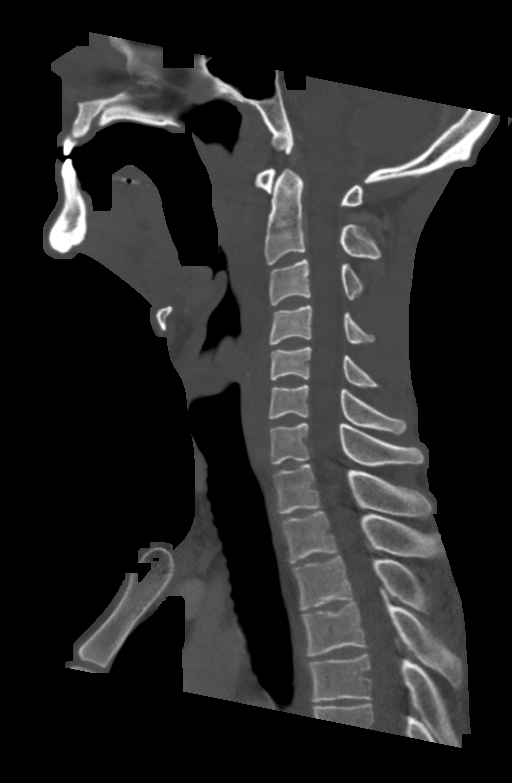
[im 39/67  bone]
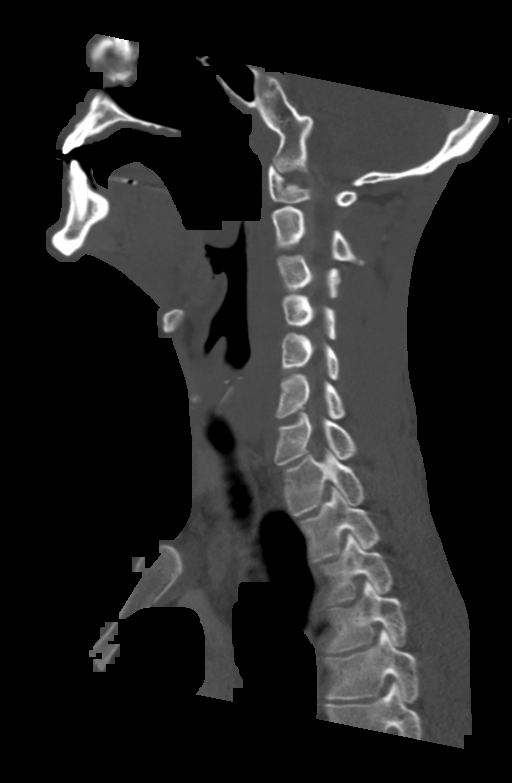
[im 45/67  bone]
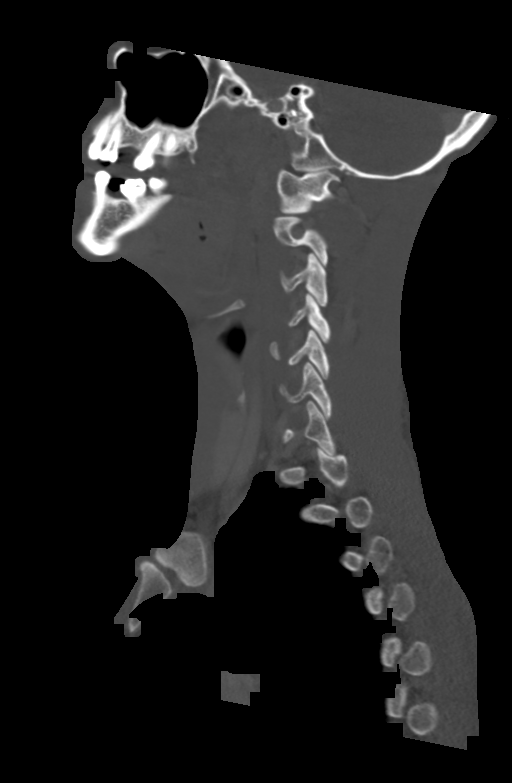

[Series 7: cor neck · coronal · 0.32mm/px · 3 of 92 slices shown]
[im 21/92  bone]
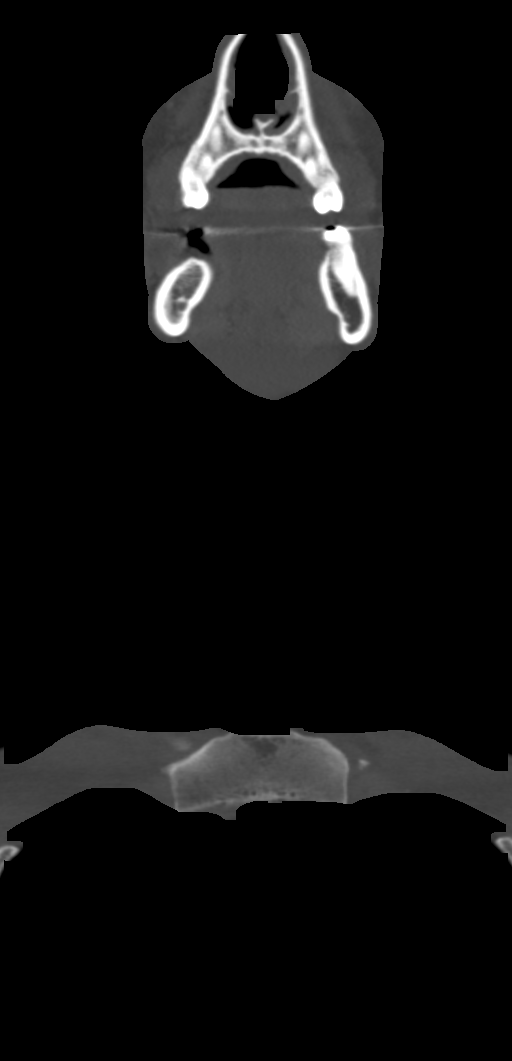
[im 38/92  bone]
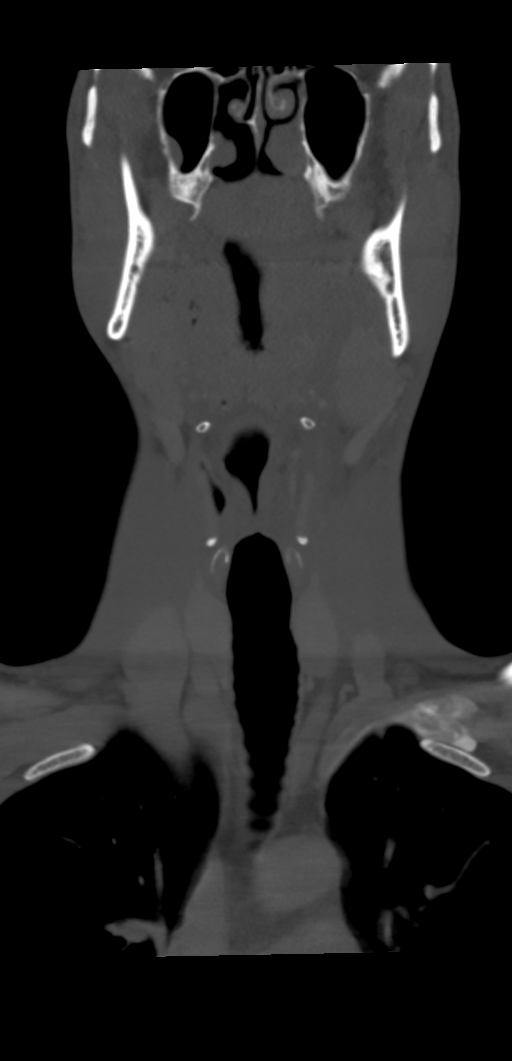
[im 55/92  bone]
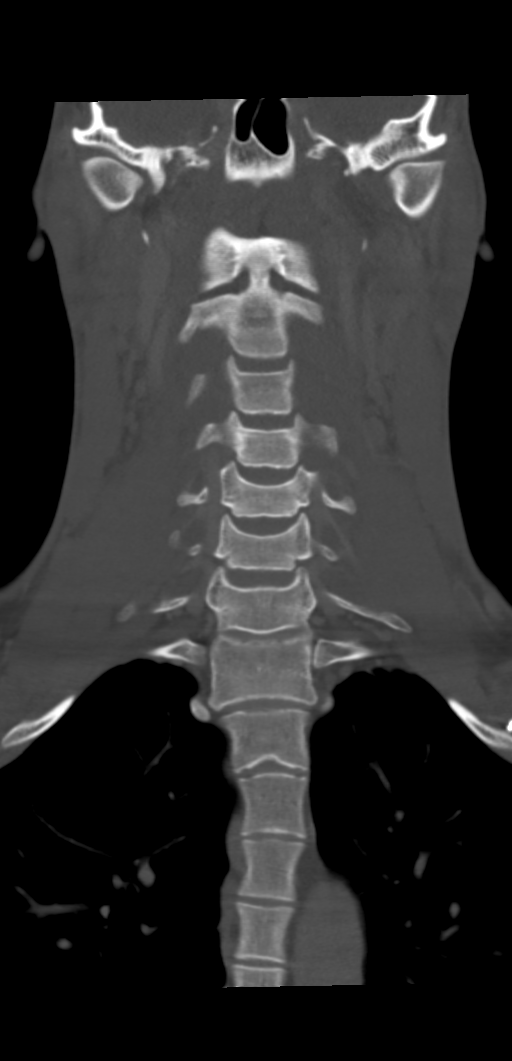

[11 of 33 positions shown; findings below may reference images not displayed]

FINDINGS: Pharynx and larynx: Rim enhancing fluid collection in the left
tonsillar fossa measuring 2.4 cm. Submucosal edema extends in the
left more than right oropharynx and supraglottic larynx. There is
retropharyngeal edema without retropharyngeal collection.

Salivary glands: Negative

Thyroid: Negative

Lymph nodes: Enlarged lymph nodes on the left more than right
consistent with adenitis. No nodal suppuration.

Vascular: Negative.  Major venous structures are patent

Limited intracranial: Negative

Visualized orbits: Minimal coverage is negative

Mastoids and visualized paranasal sinuses: Essentially clear

Skeleton: Negative

Upper chest: Clear apical lungs.
IMPRESSION: 1. 2.4 cm left peritonsillar abscess. Associated submucosal edema
tracks in the left more than right oropharynx and supraglottic
larynx. Mild retropharyngeal edema.
2. Lymphadenitis without cavitation.

## 2020-04-21 ENCOUNTER — Other Ambulatory Visit: Payer: Self-pay

## 2020-04-21 DIAGNOSIS — J029 Acute pharyngitis, unspecified: Secondary | ICD-10-CM | POA: Insufficient documentation

## 2020-04-21 NOTE — ED Triage Notes (Signed)
Pt states this morning when he woke up he had a sore throat and has gotten worse throughout the day. Pt denies cough or fever, no difficulty swallowing or breathing.

## 2020-04-22 ENCOUNTER — Emergency Department
Admission: EM | Admit: 2020-04-22 | Discharge: 2020-04-22 | Disposition: A | Discharge status: Home / Self Care | Payer: Self-pay | Attending: Emergency Medicine | Admitting: Emergency Medicine

## 2020-04-22 DIAGNOSIS — J029 Acute pharyngitis, unspecified: Secondary | ICD-10-CM

## 2020-04-22 LAB — GROUP A STREP BY PCR: Group A Strep by PCR: NOT DETECTED

## 2020-04-22 MED ORDER — AMOXICILLIN 500 MG PO CAPS
500.0000 mg | ORAL_CAPSULE | Freq: Once | ORAL | Status: AC
  Administered 2020-04-22: 500 mg via ORAL
  Filled 2020-04-22: qty 1

## 2020-04-22 MED ORDER — DEXAMETHASONE 10 MG/ML FOR PEDIATRIC ORAL USE
10.0000 mg | Freq: Once | INTRAMUSCULAR | Status: AC
  Administered 2020-04-22: 10 mg via ORAL
  Filled 2020-04-22: qty 1

## 2020-04-22 MED ORDER — KETOROLAC TROMETHAMINE 30 MG/ML IJ SOLN
30.0000 mg | Freq: Once | INTRAMUSCULAR | Status: AC
  Administered 2020-04-22: 30 mg via INTRAMUSCULAR
  Filled 2020-04-22: qty 1

## 2020-04-22 MED ORDER — AMOXICILLIN-POT CLAVULANATE 875-125 MG PO TABS: 1.0000 | ORAL_TABLET | Freq: Two times a day (BID) | ORAL | 0 refills | Status: AC

## 2020-04-22 NOTE — ED Provider Notes (Signed)
[facility] Emergency Department Provider Note   ____________________________________________   Event Date/Time   First MD Initiated Contact with Patient 04/22/20 0122     (approximate)  I have reviewed the triage vital signs and the nursing notes.   HISTORY  Chief Complaint Sore Throat    HPI Timothy Roberts is a 35 y.o. male with past medical history of peritonsillar abscess who presents to the ED complaining of sore throat.  Patient reports that over the course of the day today he has noticed a gradually worsening sore throat that particularly affects the right side of his throat.  He describes the pain as sharp and worse when he goes to swallow.  He has not had any fevers, nausea, vomiting, or difficulty swallowing.  He has not taken any medications for this yet today.  He states symptoms are not as severe as when he dealt with a peritonsillar abscess in the past.        History reviewed. No pertinent past medical history.  There are no problems to display for this patient.   Past Surgical History:  Procedure Laterality Date  . INCISION AND DRAINAGE ABSCESS N/A 05/07/2018   Procedure: INCISION AND DRAINAGE ABSCESS Peritonsillar;  Surgeon: Linus Salmons, MD;  Location: [facility] ORS;  Service: ENT;  Laterality: N/A;  . LEG SURGERY      Prior to Admission medications   Medication Sig Start Date End Date Taking? Authorizing Provider  amoxicillin-clavulanate (AUGMENTIN) 875-125 MG tablet Take 1 tablet by mouth 2 (two) times daily for 7 days. 04/22/20 04/29/20 Yes Chesley Noon, MD  lidocaine (XYLOCAINE) 2 % solution Use as directed 5 mLs in the mouth or throat every 6 (six) hours as needed for mouth pain. Mix with Phenergan DM for swish and swallow. 05/06/18   Joni Reining, PA-C  promethazine-dextromethorphan (PROMETHAZINE-DM) 6.25-15 MG/5ML syrup Take 5 mLs by mouth 4 (four) times daily as needed for cough. Mix with viscous lidocaine for swish and  swallow. 05/06/18   Joni Reining, PA-C    Allergies Patient has no known allergies.  No family history on file.  Social History Social History   Tobacco Use  . Smoking status: Never Smoker  . Smokeless tobacco: Never Used  Substance Use Topics  . Alcohol use: Yes  . Drug use: Yes    Types: Marijuana    Review of Systems  Constitutional: No fever/chills Eyes: No visual changes. ENT: Positive for sore throat. Cardiovascular: Denies chest pain. Respiratory: Denies shortness of breath. Gastrointestinal: No abdominal pain.  No nausea, no vomiting.  No diarrhea.  No constipation. Genitourinary: Negative for dysuria. Musculoskeletal: Negative for back pain. Skin: Negative for rash. Neurological: Negative for headaches, focal weakness or numbness.  ____________________________________________   PHYSICAL EXAM:  VITAL SIGNS: ED Triage Vitals  Enc Vitals Group     BP 04/21/20 2214 (!) 128/92     Pulse Rate 04/21/20 2214 (!) 102     Resp 04/21/20 2214 19     Temp 04/21/20 2214 98.2 F (36.8 C)     Temp Source 04/21/20 2214 Oral     SpO2 04/21/20 2214 97 %     Weight 04/21/20 2214 170 lb (77.1 kg)     Height 04/21/20 2214 6\' 2"  (1.88 m)     Head Circumference --      Peak Flow --      Pain Score 04/21/20 2220 7     Pain Loc --      Pain  Edu? --      Excl. in GC? --     Constitutional: Alert and oriented. Eyes: Conjunctivae are normal. Head: Atraumatic. Nose: No congestion/rhinnorhea. Mouth/Throat: Mucous membranes are moist.  Tonsillar edema and erythema noted bilaterally, right greater than left with exudates and purulent drainage noted from right tonsil. Neck: Normal ROM, tender cervical lymphadenopathy noted. Cardiovascular: Normal rate, regular rhythm. Grossly normal heart sounds. Respiratory: Normal respiratory effort.  No retractions. Lungs CTAB. Gastrointestinal: Soft and nontender. No distention. Genitourinary: deferred Musculoskeletal: No lower  extremity tenderness nor edema. Neurologic:  Normal speech and language. No gross focal neurologic deficits are appreciated. Skin:  Skin is warm, dry and intact. No rash noted. Psychiatric: Mood and affect are normal. Speech and behavior are normal.  ____________________________________________   LABS (all labs ordered are listed, but only abnormal results are displayed)  Labs Reviewed  GROUP A STREP BY PCR    PROCEDURES  Procedure(s) performed (including Critical Care):  Procedures   ____________________________________________   INITIAL IMPRESSION / ASSESSMENT AND PLAN / ED COURSE       35 year old male with past medical history of peritonsillar abscess who presents to the ED with increasing sore throat over the course of the day today.  He does have tonsillar edema and erythema, right greater than left with exudates and purulence noted on the right.  Low suspicion for peritonsillar abscess at this time but he does seem to have some purulence from the right side.  I would be highly suspicious for strep pharyngitis, we will perform testing but given initial dose of amoxicillin as well as Decadron and pain medication.  Patient reports feeling better following dose of steroids and Toradol.  Strep testing is negative but given purulence and history of PTA, we will cover with antibiotics.  Patient prescribed Augmentin and counseled to follow-up with his PCP, otherwise return to the ED for new or worsening symptoms.  Patient agrees with plan.      ____________________________________________   FINAL CLINICAL IMPRESSION(S) / ED DIAGNOSES  Final diagnoses:  Pharyngitis, unspecified etiology     ED Discharge Orders         Ordered    amoxicillin-clavulanate (AUGMENTIN) 875-125 MG tablet  2 times daily        04/22/20 0345           Note:  This document was prepared using Dragon voice recognition software and may include unintentional dictation errors.   Chesley Noon, MD 04/22/20 [phone]
